# Patient Record
Sex: Male | Born: 1980 | Race: Black or African American | Hispanic: No | Marital: Single | Smoking: Never smoker
Health system: Southern US, Community
[De-identification: ages and names within clinical notes are randomized; demographics above are authoritative.]

---
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
## 2004-11-10 ENCOUNTER — Emergency Department (HOSPITAL_COMMUNITY): Admission: EM | Admit: 2004-11-10 | Discharge: 2004-11-10 | Payer: Self-pay | Admitting: Family Medicine

## 2005-10-10 HISTORY — PX: HAND SURGERY: SHX662

## 2006-04-03 ENCOUNTER — Emergency Department (HOSPITAL_COMMUNITY): Admission: EM | Admit: 2006-04-03 | Discharge: 2006-04-03 | Payer: Self-pay | Admitting: Emergency Medicine

## 2006-12-10 ENCOUNTER — Emergency Department (HOSPITAL_COMMUNITY): Admission: EM | Admit: 2006-12-10 | Discharge: 2006-12-10 | Payer: Self-pay | Admitting: Emergency Medicine

## 2006-12-14 ENCOUNTER — Ambulatory Visit (HOSPITAL_COMMUNITY): Admission: RE | Admit: 2006-12-14 | Discharge: 2006-12-15 | Payer: Self-pay | Admitting: *Deleted

## 2007-02-21 ENCOUNTER — Encounter: Admission: RE | Admit: 2007-02-21 | Discharge: 2007-04-25 | Payer: Self-pay | Admitting: *Deleted

## 2009-03-12 ENCOUNTER — Emergency Department (HOSPITAL_COMMUNITY): Admission: EM | Admit: 2009-03-12 | Discharge: 2009-03-13 | Payer: Self-pay | Admitting: Emergency Medicine

## 2009-03-20 ENCOUNTER — Emergency Department (HOSPITAL_COMMUNITY): Admission: EM | Admit: 2009-03-20 | Discharge: 2009-03-20 | Payer: Self-pay | Admitting: Family Medicine

## 2009-09-23 ENCOUNTER — Emergency Department (HOSPITAL_COMMUNITY): Admission: EM | Admit: 2009-09-23 | Discharge: 2009-09-23 | Payer: Self-pay | Admitting: Family Medicine
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
## 2011-04-02 ENCOUNTER — Emergency Department (HOSPITAL_COMMUNITY)
Admission: EM | Admit: 2011-04-02 | Discharge: 2011-04-02 | Disposition: A | Discharge status: Home / Self Care | Payer: Self-pay | Attending: Emergency Medicine | Admitting: Emergency Medicine

## 2011-04-02 DIAGNOSIS — S61409A Unspecified open wound of unspecified hand, initial encounter: Secondary | ICD-10-CM | POA: Insufficient documentation

## 2011-04-02 LAB — CBC
HCT: 39.4 % (ref 39.0–52.0)
MCHC: 36.3 g/dL — ABNORMAL HIGH (ref 30.0–36.0)
MCV: 81.7 fL (ref 78.0–100.0)
Platelets: 196 10*3/uL (ref 150–400)
RDW: 12.6 % (ref 11.5–15.5)

## 2011-04-02 LAB — POCT I-STAT, CHEM 8
Calcium, Ion: 1.12 mmol/L (ref 1.12–1.32)
Chloride: 108 mEq/L (ref 96–112)
Creatinine, Ser: 1.2 mg/dL (ref 0.50–1.35)
Glucose, Bld: 87 mg/dL (ref 70–99)
HCT: 44 % (ref 39.0–52.0)
Hemoglobin: 15 g/dL (ref 13.0–17.0)
Potassium: 4 mEq/L (ref 3.5–5.1)

## 2011-04-02 LAB — DIFFERENTIAL
Eosinophils Absolute: 0 10*3/uL (ref 0.0–0.7)
Eosinophils Relative: 1 % (ref 0–5)
Lymphocytes Relative: 22 % (ref 12–46)
Lymphs Abs: 1.8 10*3/uL (ref 0.7–4.0)
Monocytes Absolute: 0.7 10*3/uL (ref 0.1–1.0)

## 2011-04-07 ENCOUNTER — Encounter: Payer: Self-pay | Admitting: Occupational Therapy

## 2011-04-12 ENCOUNTER — Ambulatory Visit: Payer: Self-pay | Attending: Orthopedic Surgery | Admitting: Occupational Therapy

## 2011-04-12 DIAGNOSIS — R609 Edema, unspecified: Secondary | ICD-10-CM | POA: Insufficient documentation

## 2011-04-12 DIAGNOSIS — IMO0001 Reserved for inherently not codable concepts without codable children: Secondary | ICD-10-CM | POA: Insufficient documentation

## 2011-04-12 DIAGNOSIS — M25549 Pain in joints of unspecified hand: Secondary | ICD-10-CM | POA: Insufficient documentation

## 2011-04-20 ENCOUNTER — Ambulatory Visit: Payer: Self-pay | Admitting: Occupational Therapy
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
## 2011-05-03 NOTE — Op Note (Signed)
  NAME:  [name], Roger Maldonado [name]  [name]  LOCATION:  [facility]                         FACILITY:  [facility]  PHYSICIAN:  Artist Pais. Axavier Pressley, M.D.DATE OF BIRTH:  [birth year]  DATE OF PROCEDURE:  04/02/2011 DATE OF DISCHARGE:                              OPERATIVE REPORT   PREOPERATIVE DIAGNOSIS:  Deep laceration, web space, thumb, index finger, dominant right hand.  POSTOPERATIVE DIAGNOSIS:  Deep laceration, web space, thumb, index finger, dominant right hand.  PROCEDURE:  Exploration and repair of thumb abductor tendon and debridement.  SURGEON:  Artist Pais. Mina Marble, MD  ASSISTANT:  None.  ANESTHESIA:  General.  TOURNIQUET TIME:  28 minutes.  COMPLICATIONS:  None.  DRAINS:  None.  PROCEDURE IN DETAIL:  The patient was taken to operating suite after induction of general anesthesia.  Right upper extremity was prepped and draped in sterile fashion.  Esmarch was used to exsanguinate the limb. Tourniquet was inflated to 250 mmHg.  At this point in time, sutures were replaced in the emergency room, removed, the web space was opened up.  There was a complete laceration at insertion of thumb abductor tendon.  The wound was thoroughly irrigated.  Large amounts of clot were debrided.  There was a small venous structure on the volar aspect that was cauterized using bipolar cautery.  Once this was done, the thumb abductor tendons were repaired with 3-0 FiberWire suture and horizontal mattress sutures x3.  Thus shortening the web space to its normal anatomic configuration.  At this point in time, the skin was closed with 4-0 Vicryl Rapide and a combination of horizontal mattress simple sutures, was dressed with 4x4s, fluffs, and radial gutter splint.  The patient tolerated the procedure well and went to recovery room in stable fashion.     [name], M.D.    MAW/MEDQ  D:  04/02/2011  T:  04/02/2011  Job:   [id number]  Electronically Signed by Dairl Ponder M.D. on 05/03/2011 08:35:19 PM

## 2015-05-02 ENCOUNTER — Emergency Department (HOSPITAL_COMMUNITY)
Admission: EM | Admit: 2015-05-02 | Discharge: 2015-05-02 | Disposition: A | Discharge status: Home / Self Care | Payer: Self-pay | Attending: Emergency Medicine | Admitting: Emergency Medicine

## 2015-05-02 ENCOUNTER — Encounter (HOSPITAL_COMMUNITY): Payer: Self-pay | Admitting: *Deleted

## 2015-05-02 DIAGNOSIS — G8929 Other chronic pain: Secondary | ICD-10-CM | POA: Insufficient documentation

## 2015-05-02 DIAGNOSIS — M62830 Muscle spasm of back: Secondary | ICD-10-CM | POA: Insufficient documentation

## 2015-05-02 MED ORDER — NAPROXEN 500 MG PO TABS: 500.0000 mg | ORAL_TABLET | Freq: Two times a day (BID) | ORAL | Status: DC

## 2015-05-02 MED ORDER — CYCLOBENZAPRINE HCL 10 MG PO TABS: 10.0000 mg | ORAL_TABLET | Freq: Two times a day (BID) | ORAL | Status: DC | PRN

## 2015-05-02 NOTE — ED Notes (Signed)
Pt left with all belongings and ambulated out of treatment area.  

## 2015-05-02 NOTE — ED Notes (Signed)
The pt has chronic back pain .  His entire spine for the past few days it has been worse
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
## 2016-04-09 ENCOUNTER — Encounter (HOSPITAL_COMMUNITY): Payer: Self-pay | Admitting: Emergency Medicine

## 2016-04-09 ENCOUNTER — Ambulatory Visit (HOSPITAL_COMMUNITY)
Admission: EM | Admit: 2016-04-09 | Discharge: 2016-04-09 | Disposition: A | Discharge status: Home / Self Care | Payer: Self-pay | Attending: Family Medicine | Admitting: Family Medicine

## 2016-04-09 ENCOUNTER — Ambulatory Visit (HOSPITAL_COMMUNITY): Payer: Self-pay

## 2016-04-09 DIAGNOSIS — S60221A Contusion of right hand, initial encounter: Secondary | ICD-10-CM

## 2016-04-09 DIAGNOSIS — W2201XA Walked into wall, initial encounter: Secondary | ICD-10-CM | POA: Insufficient documentation

## 2016-04-09 DIAGNOSIS — M79641 Pain in right hand: Secondary | ICD-10-CM | POA: Insufficient documentation

## 2016-04-09 DIAGNOSIS — M7989 Other specified soft tissue disorders: Secondary | ICD-10-CM | POA: Insufficient documentation

## 2016-04-09 MED ORDER — IBUPROFEN 600 MG PO TABS: 600.0000 mg | ORAL_TABLET | Freq: Four times a day (QID) | ORAL | Status: DC | PRN

## 2016-04-09 NOTE — ED Notes (Signed)
Transferred to radiology in [place]

## 2016-04-09 NOTE — Discharge Instructions (Signed)
You have no evidence of fracture on your xray: This means that you severely bruised the area and it will just take time to resolve. Continue activities as tolerated and avoid direct blows to your hand. You may apply ice and take ibuprofen as needed for the pain.   Hand Contusion A hand contusion is a deep bruise on your hand area. Contusions are the result of an injury that caused bleeding under the skin. The contusion may turn blue, purple, or yellow. Minor injuries will give you a painless contusion, but more severe contusions may stay painful and swollen for a few weeks. CAUSES  A contusion is usually caused by a blow, trauma, or direct force to an area of the body. SYMPTOMS   Swelling and redness of the injured area.  Discoloration of the injured area.  Tenderness and soreness of the injured area.  Pain. DIAGNOSIS  The diagnosis can be made by taking a history and performing a physical exam. An X-ray, CT scan, or MRI may be needed to determine if there were any associated injuries, such as broken bones (fractures). TREATMENT  Often, the best treatment for a hand contusion is resting, elevating, icing, and applying cold compresses to the injured area. Over-the-counter medicines may also be recommended for pain control. HOME CARE INSTRUCTIONS   Put ice on the injured area.  Put ice in a plastic bag.  Place a towel between your skin and the bag.  Leave the ice on for 15-20 minutes, 03-04 times a day.  Only take over-the-counter or prescription medicines as directed by your caregiver. Your caregiver may recommend avoiding anti-inflammatory medicines (aspirin, ibuprofen, and naproxen) for 48 hours because these medicines may increase bruising.  If told, use an elastic wrap as directed. This can help reduce swelling. You may remove the wrap for sleeping, showering, and bathing. If your fingers become numb, cold, or blue, take the wrap off and reapply it more loosely.  Elevate your hand  with pillows to reduce swelling.  Avoid overusing your hand if it is painful. SEEK IMMEDIATE MEDICAL CARE IF:   You have increased redness, swelling, or pain in your hand.  Your swelling or pain is not relieved with medicines.  You have loss of feeling in your hand or are unable to move your fingers.  Your hand turns cold or blue.  You have pain when you move your fingers.  Your hand becomes warm to the touch.  Your contusion does not improve in 2 days. MAKE SURE YOU:   Understand these instructions.  Will watch your condition.  Will get help right away if you are not doing well or get worse.   This information is not intended to replace advice given to you by your health care provider. Make sure you discuss any questions you have with your health care provider.   Document Released: 03/18/2002 Document Revised: 06/20/2012 Document Reviewed: 03/19/2012 Elsevier Interactive Patient Education [employer]2016 [employer]

## 2016-04-09 NOTE — ED Notes (Signed)
Right hand pain, swelling.  Patient hit the wall one week ago.
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
## 2016-05-08 ENCOUNTER — Ambulatory Visit (HOSPITAL_COMMUNITY)
Admission: EM | Admit: 2016-05-08 | Discharge: 2016-05-08 | Disposition: A | Discharge status: Home / Self Care | Payer: Self-pay | Attending: Emergency Medicine | Admitting: Emergency Medicine

## 2016-05-08 ENCOUNTER — Encounter (HOSPITAL_COMMUNITY): Payer: Self-pay | Admitting: Emergency Medicine

## 2016-05-08 ENCOUNTER — Emergency Department (HOSPITAL_COMMUNITY)
Admission: EM | Admit: 2016-05-08 | Discharge: 2016-05-09 | Disposition: A | Discharge status: Home / Self Care | Payer: Self-pay | Attending: Emergency Medicine | Admitting: Emergency Medicine

## 2016-05-08 DIAGNOSIS — R197 Diarrhea, unspecified: Secondary | ICD-10-CM | POA: Insufficient documentation

## 2016-05-08 DIAGNOSIS — R000 Tachycardia, unspecified: Secondary | ICD-10-CM | POA: Insufficient documentation

## 2016-05-08 LAB — COMPREHENSIVE METABOLIC PANEL
ALBUMIN: 4.5 g/dL (ref 3.5–5.0)
ALK PHOS: 55 U/L (ref 38–126)
ALT: 31 U/L (ref 17–63)
ANION GAP: 10 (ref 5–15)
AST: 34 U/L (ref 15–41)
BUN: 13 mg/dL (ref 6–20)
CALCIUM: 9.2 mg/dL (ref 8.9–10.3)
CHLORIDE: 107 mmol/L (ref 101–111)
CO2: 19 mmol/L — ABNORMAL LOW (ref 22–32)
CREATININE: 1.07 mg/dL (ref 0.61–1.24)
GFR calc non Af Amer: >60 mL/min (ref >60)
GLUCOSE: 117 mg/dL — AB (ref 65–99)
Potassium: 3.8 mmol/L (ref 3.5–5.1)
SODIUM: 136 mmol/L (ref 135–145)
TOTAL PROTEIN: 7.2 g/dL (ref 6.5–8.1)
Total Bilirubin: 0.6 mg/dL (ref 0.3–1.2)

## 2016-05-08 LAB — CBC WITH DIFFERENTIAL/PLATELET
BASOS ABS: 0 10*3/uL (ref 0.0–0.1)
Basophils Relative: 0 %
EOS PCT: 0 %
Eosinophils Absolute: 0 10*3/uL (ref 0.0–0.7)
HCT: 41.2 % (ref 39.0–52.0)
Hemoglobin: 14.4 g/dL (ref 13.0–17.0)
LYMPHS PCT: 15 %
Lymphs Abs: 2.1 10*3/uL (ref 0.7–4.0)
MCH: 29 pg (ref 26.0–34.0)
MCHC: 35 g/dL (ref 30.0–36.0)
MCV: 82.9 fL (ref 78.0–100.0)
MONO ABS: 0.8 10*3/uL (ref 0.1–1.0)
Monocytes Relative: 5 %
Neutro Abs: 11.1 10*3/uL — ABNORMAL HIGH (ref 1.7–7.7)
Neutrophils Relative %: 80 %
PLATELETS: 219 10*3/uL (ref 150–400)
RBC: 4.97 MIL/uL (ref 4.22–5.81)
RDW: 13.4 % (ref 11.5–15.5)
WBC: 13.9 10*3/uL — ABNORMAL HIGH (ref 4.0–10.5)

## 2016-05-08 MED ORDER — SODIUM CHLORIDE 0.9 % IV BOLUS (SEPSIS)
2000.0000 mL | Freq: Once | INTRAVENOUS | Status: AC
  Administered 2016-05-08: 2000 mL via INTRAVENOUS

## 2016-05-08 MED ORDER — SODIUM CHLORIDE 0.9 % IV SOLN
Freq: Once | INTRAVENOUS | Status: DC
  Administered 2016-05-08: 21:00:00 via INTRAVENOUS

## 2016-05-08 NOTE — ED Notes (Signed)
Dr. Jacqulyn Bath notified of pt HR

## 2016-05-08 NOTE — ED Provider Notes (Signed)
Emergency Department Provider Note   I have reviewed the triage vital signs and the nursing notes.   HISTORY  Chief Complaint Tachycardia   HPI Roger Maldonado is a 35 y.o. male with no significant PMH presents to the emergency department for evaluation of tachycardia in the setting of diarrhea throughout the night. Patient states he is tracking alcohol last night but denies using other drugs. He developed approximately 3 episodes of nonbloody diarrhea which prompted his visit to urgent care today. He denies any associated abdominal pain, chest pain, difficulty breathing. During that evaluation is found to have a heart rate greater than 150 and transferred to the emergency department after IV fluids. Patient has no other complaints such as nausea or vomiting. No palpitations.    History reviewed. No pertinent past medical history.  There are no active problems to display for this patient.   History reviewed. No pertinent surgical history.    Allergies Review of patient's allergies indicates no known allergies.  No family history on file.  Social History Social History  Substance Use Topics  . Smoking status: Never Smoker  . Smokeless tobacco: Not on file  . Alcohol use Yes    Review of Systems  Constitutional: No fever/chills Eyes: No visual changes. ENT: No sore throat. Cardiovascular: Denies chest pain. Respiratory: Denies shortness of breath. Gastrointestinal: No abdominal pain.  No nausea, no vomiting. Positive diarrhea.  No constipation. Genitourinary: Negative for dysuria. Musculoskeletal: Negative for back pain. Skin: Negative for rash. Neurological: Negative for headaches, focal weakness or numbness.  10-point ROS otherwise negative.  ____________________________________________   PHYSICAL EXAM:  VITAL SIGNS: ED Triage Vitals  Enc Vitals Group     BP 05/08/16 2125 173/90     Pulse Rate 05/08/16 2125 (!) 135     Resp 05/08/16 2125 19     Temp  05/08/16 2125 99.3 F (37.4 C)     Temp Source 05/08/16 2125 Oral     SpO2 05/08/16 2125 97 %     Pain Score 05/08/16 2122 0   Constitutional: Alert and oriented. Well appearing and in no acute distress. Eyes: Conjunctivae are normal. PERRL. EOMI. Head: Atraumatic. Nose: No congestion/rhinnorhea. Mouth/Throat: Mucous membranes are moist.  Oropharynx non-erythematous. Neck: No stridor.  Cardiovascular: Tachycardia. Good peripheral circulation. Grossly normal heart sounds.   Respiratory: Normal respiratory effort.  No retractions. Lungs CTAB. Gastrointestinal: Soft and nontender. No distention.  Musculoskeletal: No lower extremity tenderness nor edema. No gross deformities of extremities. Neurologic:  Normal speech and language. No gross focal neurologic deficits are appreciated.  Skin:  Skin is warm, dry and intact. No rash noted. Psychiatric: Mood and affect are normal. Speech and behavior are normal.  ____________________________________________   LABS (all labs ordered are listed, but only abnormal results are displayed)  Labs Reviewed  COMPREHENSIVE METABOLIC PANEL - Abnormal; Notable for the following:       Result Value   CO2 19 (*)    Glucose, Bld 117 (*)    All other components within normal limits  CBC WITH DIFFERENTIAL/PLATELET - Abnormal; Notable for the following:    WBC 13.9 (*)    Neutro Abs 11.1 (*)    All other components within normal limits   ____________________________________________  EKG  Sinus tachycardia. Reviewed in MUSE.  ____________________________________________  RADIOLOGY  None ____________________________________________   PROCEDURES  Procedure(s) performed:   Procedures  None ____________________________________________   INITIAL IMPRESSION / ASSESSMENT AND PLAN / ED COURSE  Pertinent labs & imaging results that  were available during my care of the patient were reviewed by me and considered in my medical decision making (see  chart for details).  Patient resents to the emergency department for evaluation of tachycardia in the setting of diarrhea. Patient with a sinus tachycardia on EKG rate 132 down from 150s at [facility] after IVF. Clinically suspect dehydration to leave the underlying cause of the patient's symptoms. He has no chest pain or hypoxemia to suggest pulmonary embolism. Patient has no risk factors for ACS. Denies illicit drug use or prescription drug use. Patient is awake, alert, well perfused. We'll check baseline labs and give 2 L of additional IV fluids. Plan for reassessment at that time with likely discharge.   10:57 PM Patient with leukocytosis on labs and mild decrease is CO2. Patient is asking to be discharged and reports that he feels fine. Denies pain. Repeat abdominal exam is negative for focal tenderness. Discussed return precautions in detail. HR remains elevated but is down-trending with IVF.   At this time, I do not feel there is any life-threatening condition present. I have reviewed and discussed all results (EKG, imaging, lab, urine as appropriate), exam findings with patient. I have reviewed nursing notes and appropriate previous records.  I feel the patient is safe to be discharged home without further emergent workup. Discussed usual and customary return precautions. Patient and family (if present) verbalize understanding and are comfortable with this plan.  Patient will follow-up with their primary care provider. If they do not have a primary care provider, information for follow-up has been provided to them. All questions have been answered.  ____________________________________________  FINAL CLINICAL IMPRESSION(S) / ED DIAGNOSES  Final diagnoses:  Tachycardia  Diarrhea, unspecified type     MEDICATIONS GIVEN DURING THIS VISIT:  Medications  sodium chloride 0.9 % bolus 2,000 mL (2,000 mLs Intravenous New Bag/Given 05/08/16 2200)     NEW OUTPATIENT MEDICATIONS STARTED DURING THIS  VISIT:  None   Note:  This document was prepared using Dragon voice recognition software and may include unintentional dictation errors.  Alona Bene, MD Emergency Medicine   Maia Plan, MD 05/08/16 [id number]

## 2016-05-08 NOTE — ED Triage Notes (Signed)
The patient presented to the [facility] with a complaint of epigastric abdominal pain along with N/V/D that started last night.

## 2016-05-08 NOTE — ED Triage Notes (Signed)
Per [facility], pt initially c/o abdominal and diarrhea at UC, during assessment at UC, pt HR 150s, suspected of dehydration. Pt denies doing drugs. Pt resting heart rate 136. Pt denies SOB, denies CP. BP 167/106. Ambulatory. Pt given 600cc saline by UC.

## 2016-05-08 NOTE — ED Notes (Signed)
Patient placed on cardiac monitor per Provider's verbal order.

## 2016-05-08 NOTE — ED Provider Notes (Signed)
CSN: [id number]     Arrival date & time 05/08/16  1903 History   None    Chief Complaint  Patient presents with  . Abdominal Pain   (Consider location/radiation/quality/duration/timing/severity/associated sxs/prior Treatment)  HPI   The patient is a 35 year old male presenting with abdominal pain with nausea vomiting and diarrhea starting yesterday after eating some [place] food.   History reviewed. No pertinent past medical history. History reviewed. No pertinent surgical history. History reviewed. No pertinent family history. Social History  Substance Use Topics  . Smoking status: Never Smoker  . Smokeless tobacco: Not on file  . Alcohol use Yes    Review of Systems  Constitutional: Negative.  Negative for chills, fatigue and fever.  HENT: Negative.   Eyes: Negative.   Respiratory: Negative.  Negative for chest tightness, shortness of breath and wheezing.   Cardiovascular: Negative.  Negative for chest pain.  Gastrointestinal: Positive for abdominal pain, diarrhea, nausea and vomiting.  Endocrine: Negative.   Genitourinary: Negative.   Musculoskeletal: Negative.   Skin: Negative.  Negative for color change.  Allergic/Immunologic: Negative.   Neurological: Negative.   Hematological: Negative.   Psychiatric/Behavioral: Negative.       Allergies  Review of patient's allergies indicates no known allergies.  Home Medications   Prior to Admission medications   Medication Sig Start Date End Date Taking? Authorizing Provider  cyclobenzaprine (FLEXERIL) 10 MG tablet Take 1 tablet (10 mg total) by mouth 2 (two) times daily as needed for muscle spasms. 05/02/15   Hope Orlene Och, NP  ibuprofen (ADVIL,MOTRIN) 600 MG tablet Take 1 tablet (600 mg total) by mouth every 6 (six) hours as needed for moderate pain. 04/09/16   Tyrone Nine, MD  naproxen (NAPROSYN) 500 MG tablet Take 1 tablet (500 mg total) by mouth 2 (two) times daily. 05/02/15   Hope Orlene Och, NP   Meds Ordered and  Administered this Visit   Medications - No data to display  BP 157/90 (BP Location: Left Arm)   Pulse (!) 124   Temp 100.7 F (38.2 C) (Temporal)   Resp 18   SpO2 99%  No data found.   Physical Exam  Constitutional: He appears well-developed and well-nourished. No distress.  Cardiovascular: Regular rhythm and intact distal pulses.  Tachycardia present.  PMI is not displaced.  Exam reveals no gallop, no distant heart sounds and no friction rub.   No murmur heard. S1/S2 difficult to evaluate secondary to tachycardia.  Patient has resting HR greater than 150.  Does not appear to be in any acute distress and denies chest pain or SOB.   Pulmonary/Chest: Effort normal and breath sounds normal. No respiratory distress. He has no wheezes. He has no rales. He exhibits no tenderness.  Abdominal: Soft. He exhibits no distension and no mass. There is no tenderness. There is no rebound and no guarding. No hernia.  Heartbeat audible in all quadrants of abdomen.   Skin: Skin is warm and dry. He is not diaphoretic.  Nursing note and vitals reviewed.    Urgent Care Course   Clinical Course    Procedures (including critical care time)  Labs Review Labs Reviewed - No data to display  Imaging Review No results found.  EKG shows sinus tachycardia otherwise normal ECG  ventricular rate is 154 bpm  PR interval 116 ms  QRS duration 78 ms  QT/QTc is 264/422 ms  P R T axes 72 68 47  MDM   1. Tachycardia    Plan  of care was discussed with Dr. Piedad Climes.  EKG and IV fluids wide open and transfer to [facility] ED for further evaluation and work up. The patient verbalizes understanding and agrees to plan of care.       Servando Salina, NP 05/08/16 2128

## 2016-05-08 NOTE — Discharge Instructions (Signed)
You were seen in the ED today with diarrhea. We evaluated you closely and believe that you are safe to return home. We gave IV fluids for your high heart rate but you should continue to drink water to stay hydrated. Return to the emergency department immediately with any abdominal pain, lightheadedness, blood in the diarrhea, chest pain, or difficulty breathing.

## 2016-05-08 NOTE — ED Notes (Signed)
Patient report called to Clydie Braun, Geophysicist/field seismologist at [facility] ED

## 2016-07-13 ENCOUNTER — Encounter (HOSPITAL_COMMUNITY): Payer: Self-pay | Admitting: Emergency Medicine

## 2016-07-13 ENCOUNTER — Ambulatory Visit (HOSPITAL_COMMUNITY)
Admission: EM | Admit: 2016-07-13 | Discharge: 2016-07-13 | Disposition: A | Discharge status: Home / Self Care | Payer: Self-pay | Attending: Family Medicine | Admitting: Family Medicine

## 2016-07-13 DIAGNOSIS — K0889 Other specified disorders of teeth and supporting structures: Secondary | ICD-10-CM

## 2016-07-13 DIAGNOSIS — K047 Periapical abscess without sinus: Secondary | ICD-10-CM

## 2016-07-13 MED ORDER — IBUPROFEN 800 MG PO TABS: 800.0000 mg | ORAL_TABLET | Freq: Three times a day (TID) | ORAL | 0 refills | Status: DC

## 2016-07-13 MED ORDER — CLINDAMYCIN HCL 300 MG PO CAPS: 300.0000 mg | ORAL_CAPSULE | Freq: Three times a day (TID) | ORAL | 0 refills | Status: AC

## 2016-07-13 NOTE — ED Provider Notes (Signed)
CSN: [id number]     Arrival date & time 07/13/16  1559 History   First MD Initiated Contact with Patient 07/13/16 1707     Chief Complaint  Patient presents with  . Dental Pain   (Consider location/radiation/quality/duration/timing/severity/associated sxs/prior Treatment) Patient reports not having a dentist.    The history is provided by the patient.  Dental Pain  Location:  Lower Lower teeth location:  32/RL 3rd molar Quality:  Unable to specify Severity:  Moderate (7/10) Onset quality:  Sudden Duration:  1 day Timing:  Constant Progression:  Waxing and waning Chronicity:  New Context: abscess, dental caries, dental fracture and poor dentition   Relieved by:  None tried Ineffective treatments:  None tried   History reviewed. No pertinent past medical history. History reviewed. No pertinent surgical history. History reviewed. No pertinent family history. Social History  Substance Use Topics  . Smoking status: Never Smoker  . Smokeless tobacco: Not on file  . Alcohol use Yes    Review of Systems  All other systems reviewed and are negative.   Allergies  Review of patient's allergies indicates no known allergies.  Home Medications   Prior to Admission medications   Medication Sig Start Date End Date Taking? Authorizing Provider  clindamycin (CLEOCIN) 300 MG capsule Take 1 capsule (300 mg total) by mouth 3 (three) times daily. 07/13/16 07/20/16  Lucia EstelleFeng Adrienne Trombetta, NP  ibuprofen (ADVIL,MOTRIN) 800 MG tablet Take 1 tablet (800 mg total) by mouth 3 (three) times daily. 07/13/16   Lucia EstelleFeng Haruye Lainez, NP   Meds Ordered and Administered this Visit  Medications - No data to display  BP 130/67 (BP Location: Left Arm)   Pulse 105   Temp 98.5 F (36.9 C) (Oral)   Resp 12   SpO2 100%  No data found.   Physical Exam  Constitutional: He appears well-developed and well-nourished.  HENT:  Mouth/Throat:    Dental pain at the left lower 3rd molar with swelling at the gumline and  tender to palpate at the gumline. No facial swelling present.  Cardiovascular: Normal rate.   Pulmonary/Chest: Effort normal.  Nursing note and vitals reviewed.   Urgent Care Course   Clinical Course    Procedures (including critical care time)  Labs Review Labs Reviewed - No data to display  Imaging Review No results found.   Visual Acuity Review  Right Eye Distance:   Left Eye Distance:   Bilateral Distance:    Right Eye Near:   Left Eye Near:    Bilateral Near:         MDM   1. Pain, dental   2. Dental abscess    Prescriptions given (see above). Reviewed directions for usage and side effects. Patient states understanding and will call with questions or problems. Patient referral to a dentist; instructed to f/u with the dentist.  Discharge instruction given.    Lucia EstelleFeng Ashlynne Shetterly, NP 07/13/16 1744

## 2016-07-13 NOTE — ED Triage Notes (Signed)
[facility] pain secondary to a possible abscess that started hurting today.

## 2016-07-13 NOTE — Discharge Instructions (Signed)
Please call Dr. Norris Crossurner's office at the number below to schedule an appointment. Take the ibuprofen for pain and the antibiotic for the infection.

## 2016-09-22 ENCOUNTER — Ambulatory Visit: Payer: Self-pay

## 2016-09-22 ENCOUNTER — Other Ambulatory Visit: Payer: Self-pay | Admitting: Occupational Medicine

## 2016-09-22 DIAGNOSIS — M25572 Pain in left ankle and joints of left foot: Secondary | ICD-10-CM

## 2016-09-22 DIAGNOSIS — M25531 Pain in right wrist: Secondary | ICD-10-CM

## 2016-09-23 ENCOUNTER — Ambulatory Visit (HOSPITAL_COMMUNITY)
Admission: EM | Admit: 2016-09-23 | Discharge: 2016-09-23 | Disposition: A | Discharge status: Home / Self Care | Payer: Self-pay | Attending: Internal Medicine | Admitting: Internal Medicine

## 2016-09-23 ENCOUNTER — Ambulatory Visit (HOSPITAL_COMMUNITY): Payer: Self-pay

## 2016-09-23 ENCOUNTER — Encounter (HOSPITAL_COMMUNITY): Payer: Self-pay | Admitting: Emergency Medicine

## 2016-09-23 DIAGNOSIS — S6991XA Unspecified injury of right wrist, hand and finger(s), initial encounter: Secondary | ICD-10-CM

## 2016-09-23 MED ORDER — IBUPROFEN 800 MG PO TABS: 800.0000 mg | ORAL_TABLET | Freq: Three times a day (TID) | ORAL | 0 refills | Status: DC

## 2016-09-23 NOTE — ED Provider Notes (Signed)
CSN: [id number]     Arrival date & time 09/23/16  1658 History   First MD Initiated Contact with Patient 09/23/16 1747     Chief Complaint  Patient presents with  . Hand Injury   (Consider location/radiation/quality/duration/timing/severity/associated sxs/prior Treatment) Patient fell and injured right wrist a couple days ago and he is having right wrist discomfort. He had hx of ORIF right wrist due to fracture and wants to make sure the screws and plates are still intact.   The history is provided by the patient.  Hand Injury  Location:  Wrist Wrist location:  R wrist Injury: yes   Time since incident:  2 days Pain details:    Quality:  Aching   Radiates to:  R wrist   Severity:  Moderate   Onset quality:  Sudden   Duration:  2 days   Timing:  Constant   Progression:  Partially resolved Handedness:  Right-handed Dislocation: no   Foreign body present:  No foreign bodies Tetanus status:  Unknown Prior injury to area:  No Relieved by:  Nothing Worsened by:  Nothing   History reviewed. No pertinent past medical history. Past Surgical History:  Procedure Laterality Date  . HAND SURGERY Right 2007   History reviewed. No pertinent family history. Social History  Substance Use Topics  . Smoking status: Never Smoker  . Smokeless tobacco: Never Used  . Alcohol use Yes    Review of Systems  Constitutional: Negative.   HENT: Negative.   Eyes: Negative.   Respiratory: Negative.   Cardiovascular: Negative.   Gastrointestinal: Negative.   Endocrine: Negative.   Genitourinary: Negative.   Musculoskeletal: Positive for arthralgias.  Skin: Negative.   Allergic/Immunologic: Negative.   Neurological: Negative.   Hematological: Negative.   Psychiatric/Behavioral: Negative.     Allergies  Patient has no known allergies.  Home Medications   Prior to Admission medications   Medication Sig Start Date End Date Taking? Authorizing Provider  ibuprofen (ADVIL,MOTRIN) 800  MG tablet Take 1 tablet (800 mg total) by mouth 3 (three) times daily. 09/23/16   Deatra CanterWilliam J Oxford, FNP   Meds Ordered and Administered this Visit  Medications - No data to display  BP (!) 140/110 (BP Location: Left Arm)   Pulse 98   Temp 98.4 F (36.9 C) (Oral)   Resp 20   SpO2 100%  No data found.   Physical Exam  Constitutional: He appears well-developed and well-nourished.  HENT:  Head: Normocephalic and atraumatic.  Eyes: Conjunctivae and EOM are normal. Pupils are equal, round, and reactive to light.  Cardiovascular: Normal rate, regular rhythm and normal heart sounds.   Pulmonary/Chest: Effort normal and breath sounds normal.  Abdominal: Soft. Bowel sounds are normal.  Musculoskeletal: He exhibits tenderness.  TTP right wrist  Nursing note and vitals reviewed.   Urgent Care Course   Clinical Course     Procedures (including critical care time)  Labs Review Labs Reviewed - No data to display  Imaging Review Dg Wrist Complete Right  Result Date: 09/22/2016 CLINICAL DATA:  Fall today.  History of fracture EXAM: RIGHT WRIST - COMPLETE 3+ VIEW COMPARISON:  04/09/2016 FINDINGS: Chronic fracture distal radius with dorsal plate and screw fixation. Satisfactory fracture healing. Chronic fracture deformity distal ulna. Negative for acute fracture. Cyst in the distal navicular with surrounding sclerosis unchanged from the prior study. This may be degenerative. IMPRESSION: Negative for acute fracture. Cyst in the distal navicular unchanged. Electronically Signed   By: Marlan Palauharles  Clark M.D.  On: 09/22/2016 11:23   Dg Ankle Complete Left  Result Date: 09/22/2016 CLINICAL DATA:  Fall today.  Medial left ankle pain. EXAM: LEFT ANKLE COMPLETE - 3+ VIEW COMPARISON:  None. FINDINGS: There is no evidence of fracture, dislocation, or joint effusion. There is no evidence of arthropathy or other focal bone abnormality. Soft tissues are unremarkable. IMPRESSION: Negative. Electronically  Signed   By: Charlett NoseKevin  Dover M.D.   On: 09/22/2016 11:22     Visual Acuity Review  Right Eye Distance:   Left Eye Distance:   Bilateral Distance:    Right Eye Near:   Left Eye Near:    Bilateral Near:         MDM   1. Injury of right wrist, initial encounter    Motrin 800mg  one po tid x 10 days Reassured patient that his xrays show no acute abnormalities.    Deatra CanterWilliam J Oxford, FNP 09/23/16 [phone]

## 2016-09-23 NOTE — ED Triage Notes (Signed)
Pt reports he fell backwarkds 2 days ago at work onto concrete flooring and inj his right hand/wrist  Pain increases w/activity  Reports hx of surg to right hand w/hardware  No deformity visible.... Radial pulse 2+   A&O x4... NAD

## 2016-09-23 NOTE — Discharge Instructions (Signed)
Wrist Pain, Adult There are many things that can cause wrist pain. Some common causes include:  An injury to the wrist area.  Overuse of the joint.  A condition that causes too much pressure to be put on a nerve in the wrist (carpal tunnel syndrome).  Wear and tear of the joints that happens as a person gets older (osteoarthritis).  Other types of arthritis.  Sometimes, the cause of wrist pain is not known. Often, the pain goes away when you follow your doctor's instructions for helping pain at home, such as resting or icing your wrist. If your wrist pain does not go away, it is important to tell your doctor. Follow these instructions at home:  Rest the wrist area for 48 hours or more, or as long as told by your doctor.  If a splint or elastic bandage has been put on your wrist, use it as told by your doctor. ? Take off the splint or bandage only as told by your doctor. ? Loosen the splint or bandage if your fingers tingle, lose feeling (get numb), or turn cold or blue.  If directed, apply ice to the injured area: ? If you have a removable splint or elastic bandage, remove it as told by your doctor. ? Put ice in a plastic bag. ? Place a towel between your skin and the bag or between your splint or bandage and the bag. ? Leave the ice on for 20 minutes, 2-3 times a day.  Keep your arm raised (elevated) above the level of your heart while you are sitting or lying down.  Take over-the-counter and prescription medicines only as told by your doctor.  Keep all follow-up visits as told by your doctor. This is important. Contact a doctor if:  You have a sudden sharp pain in the wrist, hand, or arm that is different or new.  The swelling or bruising on your wrist or hand gets worse.  Your skin becomes red, gets a rash, or has open sores.  Your pain does not get better or it gets worse. Get help right away if:  You lose feeling in your fingers or hand.  Your fingers turn white,  very red, or cold and blue.  You cannot move your fingers.  You have a fever or chills. This information is not intended to replace advice given to you by your health care provider. Make sure you discuss any questions you have with your health care provider. Document Released: 03/14/2008 Document Revised: 04/21/2016 Document Reviewed: 04/14/2016 Elsevier Interactive Patient Education  2017 [employer]  

## 2019-01-30 ENCOUNTER — Other Ambulatory Visit: Payer: Self-pay

## 2019-01-30 ENCOUNTER — Encounter (HOSPITAL_COMMUNITY): Payer: Self-pay | Admitting: Emergency Medicine

## 2019-01-30 ENCOUNTER — Ambulatory Visit (HOSPITAL_COMMUNITY)
Admission: EM | Admit: 2019-01-30 | Discharge: 2019-01-30 | Disposition: A | Discharge status: Home / Self Care | Payer: BLUE CROSS/BLUE SHIELD | Attending: Family Medicine | Admitting: Family Medicine

## 2019-01-30 DIAGNOSIS — M545 Low back pain, unspecified: Secondary | ICD-10-CM

## 2019-01-30 MED ORDER — CYCLOBENZAPRINE HCL 5 MG PO TABS: 5.0000 mg | ORAL_TABLET | Freq: Two times a day (BID) | ORAL | 0 refills | Status: DC | PRN

## 2019-01-30 MED ORDER — IBUPROFEN 800 MG PO TABS: 800.0000 mg | ORAL_TABLET | Freq: Three times a day (TID) | ORAL | 0 refills | Status: DC

## 2019-01-30 NOTE — Discharge Instructions (Addendum)
Use anti-inflammatories for pain/swelling. You may take up to 800 mg Ibuprofen every 8 hours with food. You may supplement Ibuprofen with Tylenol [phone] mg every 8 hours.   You may use flexeril as needed to help with pain. This is a muscle relaxer and causes sedation- please use only at bedtime or when you will be home and not have to drive/work  Alternate Ice and heat  Follow up if not improving

## 2019-01-30 NOTE — ED Triage Notes (Signed)
Pt sts lower back pain after lifting heavy objects at work

## 2019-01-30 NOTE — ED Provider Notes (Signed)
[facility]    CSN: [id number] Arrival date & time: 01/30/19  1918     History   Chief Complaint Chief Complaint  Patient presents with  . Back Pain    HPI Roger Maldonado is a 38 y.o. male no significant past medical history presenting today for evaluation back pain.  Patient states that over the past 2 days he has had gradually worsening back pain.  Denies specific inciting incident or specific injury.  Denies history of back pain.  Denies any numbness or tingling.  Denies radiation into legs.  Notes that he does frequent heavy lifting at work.  He has not taken anything for his symptoms.  Denies issues with urination or bowel movements.  Denies urinary symptoms of dysuria, increased frequency or penile discharge.  Denies hematuria.  Denies history of kidney stones.  HPI  History reviewed. No pertinent past medical history.  There are no active problems to display for this patient.   Past Surgical History:  Procedure Laterality Date  . HAND SURGERY Right 2007       Home Medications    Prior to Admission medications   Medication Sig Start Date End Date Taking? Authorizing Provider  cyclobenzaprine (FLEXERIL) 5 MG tablet Take 1-2 tablets (5-10 mg total) by mouth 2 (two) times daily as needed for muscle spasms. 01/30/19   Ziare Cryder C, PA-C  ibuprofen (ADVIL) 800 MG tablet Take 1 tablet (800 mg total) by mouth 3 (three) times daily. 01/30/19   Keona Bilyeu, Junius CreamerHallie C, PA-C    Family History History reviewed. No pertinent family history.  Social History Social History   Tobacco Use  . Smoking status: Never Smoker  . Smokeless tobacco: Never Used  Substance Use Topics  . Alcohol use: Yes  . Drug use: No     Allergies   Patient has no known allergies.   Review of Systems Review of Systems  Constitutional: Negative for fatigue and fever.  Eyes: Negative for redness, itching and visual disturbance.  Respiratory: Negative for shortness of breath.    Cardiovascular: Negative for chest pain and leg swelling.  Gastrointestinal: Negative for nausea and vomiting.  Genitourinary: Negative for decreased urine volume, difficulty urinating, hematuria and urgency.  Musculoskeletal: Positive for back pain and myalgias. Negative for arthralgias.  Skin: Negative for color change, rash and wound.  Neurological: Negative for dizziness, syncope, weakness, light-headedness and headaches.     Physical Exam Triage Vital Signs ED Triage Vitals  Enc Vitals Group     BP 01/30/19 1931 (!) 170/99     Pulse Rate 01/30/19 1931 87     Resp 01/30/19 1931 18     Temp 01/30/19 1931 98 F (36.7 C)     Temp Source 01/30/19 1931 Oral     SpO2 01/30/19 1931 99 %     Weight --      Height --      Head Circumference --      Peak Flow --      Pain Score 01/30/19 1932 8     Pain Loc --      Pain Edu? --      Excl. in [place]? --    No data found.  Updated Vital Signs BP (!) 170/99 (BP Location: Left Arm)   Pulse 87   Temp 98 F (36.7 C) (Oral)   Resp 18   SpO2 99%   Visual Acuity Right Eye Distance:   Left Eye Distance:   Bilateral Distance:  Right Eye Near:   Left Eye Near:    Bilateral Near:     Physical Exam Vitals signs and nursing note reviewed.  Constitutional:      Appearance: He is well-developed.  HENT:     Head: Normocephalic and atraumatic.  Eyes:     Conjunctiva/sclera: Conjunctivae normal.  Neck:     Musculoskeletal: Neck supple.  Cardiovascular:     Rate and Rhythm: Normal rate and regular rhythm.     Heart sounds: No murmur.  Pulmonary:     Effort: Pulmonary effort is normal. No respiratory distress.     Breath sounds: Normal breath sounds.  Abdominal:     Palpations: Abdomen is soft.     Tenderness: There is no abdominal tenderness.  Musculoskeletal:     Comments: Nontender to palpation throughout entire cervical, thoracic and lumbar spine midline, minimal reproducible tenderness throughout bilateral lumbar  musculature and into gluteal area Negative straight leg raise bilaterally  Strength 5/5 and equal bilaterally at hips and knees, patellar reflex 2+  Gait without abnormality  Skin:    General: Skin is warm and dry.  Neurological:     Mental Status: He is alert.      UC Treatments / Results  Labs (all labs ordered are listed, but only abnormal results are displayed) Labs Reviewed - No data to display  EKG None  Radiology No results found.  Procedures Procedures (including critical care time)  Medications Ordered in UC Medications - No data to display  Initial Impression / Assessment and Plan / UC Course  I have reviewed the triage vital signs and the nursing notes.  Pertinent labs & imaging results that were available during my care of the patient were reviewed by me and considered in my medical decision making (see chart for details).     Back pain likely secondary to recurrent heavy lifting, likely muscular.  Do not suspect underlying bony abnormality.  Will recommend conservative treatment with anti-inflammatories and muscle relaxers.  Discussed drowsiness regarding Flexeril.  Continue to monitor, discussed activity modification.  Work note provided.Discussed strict return precautions. Patient verbalized understanding and is agreeable with plan.  Final Clinical Impressions(s) / UC Diagnoses   Final diagnoses:  Acute bilateral low back pain without sciatica     Discharge Instructions     Use anti-inflammatories for pain/swelling. You may take up to 800 mg Ibuprofen every 8 hours with food. You may supplement Ibuprofen with Tylenol [id number] mg every 8 hours.   You may use flexeril as needed to help with pain. This is a muscle relaxer and causes sedation- please use only at bedtime or when you will be home and not have to drive/work  Alternate Ice and heat  Follow up if not improving   ED Prescriptions    Medication Sig Dispense Auth. Provider   ibuprofen  (ADVIL) 800 MG tablet Take 1 tablet (800 mg total) by mouth 3 (three) times daily. 21 tablet Sayan Aldava C, PA-C   cyclobenzaprine (FLEXERIL) 5 MG tablet Take 1-2 tablets (5-10 mg total) by mouth 2 (two) times daily as needed for muscle spasms. 24 tablet Marvella Jenning, [place] C, PA-C     Controlled Substance Prescriptions [place] Controlled Substance Registry consulted? Not Applicable   Lew Dawes, [place] 01/31/19 1004

## 2019-03-28 ENCOUNTER — Other Ambulatory Visit: Payer: Self-pay

## 2019-03-28 ENCOUNTER — Encounter (HOSPITAL_COMMUNITY): Payer: Self-pay | Admitting: Family Medicine

## 2019-03-28 ENCOUNTER — Ambulatory Visit (INDEPENDENT_AMBULATORY_CARE_PROVIDER_SITE_OTHER): Payer: BC Managed Care – PPO

## 2019-03-28 ENCOUNTER — Ambulatory Visit (HOSPITAL_COMMUNITY)
Admission: EM | Admit: 2019-03-28 | Discharge: 2019-03-28 | Disposition: A | Discharge status: Home / Self Care | Payer: BC Managed Care – PPO | Attending: Family Medicine | Admitting: Family Medicine

## 2019-03-28 DIAGNOSIS — S63501A Unspecified sprain of right wrist, initial encounter: Secondary | ICD-10-CM

## 2019-03-28 MED ORDER — PREDNISONE 20 MG PO TABS: ORAL_TABLET | ORAL | 0 refills | Status: AC

## 2019-03-28 NOTE — ED Provider Notes (Signed)
[place]    CSN: [id number] Arrival date & time: 03/28/19  1541     History   Chief Complaint Chief Complaint  Patient presents with  . Wrist Pain    HPI Roger Maldonado is a 38 y.o. male.   38 yo [place] established patient with wrist pain.  He has had surgical fixation of distal radius.  The right wrist has been getting progressively sore just this week without any trauma.  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  No other joints.  The pain is predominantly in the distal ulnar styloid.  Has not taken anything for pain or inflammation    RIGHT WRIST - COMPLETE 3+ VIEW  COMPARISON:  04/09/2016  FINDINGS: Chronic fracture distal radius with dorsal plate and screw fixation. Satisfactory fracture healing. Chronic fracture deformity distal ulna.  Negative for acute fracture.  Cyst in the distal navicular with surrounding sclerosis unchanged from the prior study. This may be degenerative.  IMPRESSION: Negative for acute fracture.  Cyst in the distal navicular unchanged.   Electronically Signed   By: Franchot Gallo M.D.   On: 09/22/2016 11:23      History reviewed. No pertinent past medical history.  There are no active problems to display for this patient.   Past Surgical History:  Procedure Laterality Date  . HAND SURGERY Right 2007       Home Medications    Prior to Admission medications   Medication Sig Start Date End Date Taking? Authorizing Provider  predniSONE (DELTASONE) 20 MG tablet Two daily with food 03/28/19   Robyn Haber, MD    Family History Family History  Family history unknown: Yes    Social History Social History   Tobacco Use  . Smoking status: Never Smoker  . Smokeless tobacco: Never Used  Substance Use Topics  . Alcohol use: Yes  . Drug use: No     Allergies   Patient has no known allergies.   Review of Systems Review of Systems  All other systems reviewed and are negative.     Physical Exam Triage Vital Signs ED Triage Vitals  Enc Vitals Group     BP      Pulse      Resp      Temp      Temp src      SpO2      Weight      Height      Head Circumference      Peak Flow      Pain Score      Pain Loc      Pain Edu?      Excl. in [place]?    No data found.  Updated Vital Signs BP (!) 160/112 (BP Location: Left Arm)   Pulse 93   Temp 98.3 F (36.8 C) (Oral)   Resp 18   SpO2 99%    Physical Exam Vitals signs and nursing note reviewed.  Constitutional:      Appearance: Normal appearance. He is normal weight.  Eyes:     Conjunctiva/sclera: Conjunctivae normal.  Neck:     Musculoskeletal: Normal range of motion and neck supple.  Pulmonary:     Effort: Pulmonary effort is normal.  Musculoskeletal: Normal range of motion.        General: Tenderness present. No swelling, deformity or signs of injury.     Comments: Right wrist:  Dorsal and volar distal radius surgical scars.  Good thumb and finger movement.  No numbness.  No deformity.  Tender ulnar styloid.  Skin:    General: Skin is warm and dry.  Neurological:     General: No focal deficit present.     Mental Status: He is alert.      UC Treatments / Results  Labs (all labs ordered are listed, but only abnormal results are displayed) Labs Reviewed - No data to display  EKG None  Radiology Right wrist films unchanged from 2 years ago  Procedures Procedures (including critical care time)  Medications Ordered in UC Medications - No data to display  Initial Impression / Assessment and Plan / UC Course  I have reviewed the triage vital signs and the nursing notes.  Pertinent labs & imaging results that were available during my care of the patient were reviewed by me and considered in my medical decision making (see chart for details).    Final Clinical Impressions(s) / UC Diagnoses   Final diagnoses:  Sprain of right wrist, initial encounter     Discharge Instructions      Start the prednisone tonight Expect improvement over next 24 hours.    ED Prescriptions    Medication Sig Dispense Auth. Provider   predniSONE (DELTASONE) 20 MG tablet Two daily with food 10 tablet Elvina SidleLauenstein, Vermon Grays, MD     Controlled Substance Prescriptions [place] Controlled Substance Registry consulted? Not Applicable   Elvina SidleLauenstein, Chasidy Janak, MD 03/28/19 1723

## 2019-03-28 NOTE — ED Triage Notes (Signed)
Pt presents with chronic right wrist pain.

## 2019-03-28 NOTE — Discharge Instructions (Addendum)
Start the prednisone tonight Expect improvement over next 24 hours.

## 2021-01-15 ENCOUNTER — Emergency Department (HOSPITAL_COMMUNITY)
Admission: EM | Admit: 2021-01-15 | Discharge: 2021-01-16 | Disposition: A | Discharge status: Home / Self Care | Payer: Self-pay | Attending: Emergency Medicine | Admitting: Emergency Medicine

## 2021-01-15 ENCOUNTER — Encounter (HOSPITAL_COMMUNITY): Payer: Self-pay

## 2021-01-15 ENCOUNTER — Other Ambulatory Visit: Payer: Self-pay

## 2021-01-15 ENCOUNTER — Emergency Department (HOSPITAL_COMMUNITY): Payer: Self-pay

## 2021-01-15 DIAGNOSIS — Y9389 Activity, other specified: Secondary | ICD-10-CM | POA: Insufficient documentation

## 2021-01-15 DIAGNOSIS — W260XXA Contact with knife, initial encounter: Secondary | ICD-10-CM | POA: Insufficient documentation

## 2021-01-15 DIAGNOSIS — S61512A Laceration without foreign body of left wrist, initial encounter: Secondary | ICD-10-CM | POA: Insufficient documentation

## 2021-01-15 DIAGNOSIS — Z23 Encounter for immunization: Secondary | ICD-10-CM | POA: Insufficient documentation

## 2021-01-15 NOTE — ED Triage Notes (Signed)
Pt reports that he was at his son's birthday party, was opening a package, and cut lwft wrist with the tip of a knife.

## 2021-01-15 NOTE — ED Notes (Signed)
Wrist wrapped with gauze

## 2021-01-16 MED ORDER — TETANUS-DIPHTH-ACELL PERTUSSIS 5-2.5-18.5 LF-MCG/0.5 IM SUSY
0.5000 mL | PREFILLED_SYRINGE | Freq: Once | INTRAMUSCULAR | Status: AC
  Administered 2021-01-16: 0.5 mL via INTRAMUSCULAR
  Filled 2021-01-16: qty 0.5

## 2021-01-16 MED ORDER — LIDOCAINE-EPINEPHRINE (PF) 2 %-1:200000 IJ SOLN
10.0000 mL | Freq: Once | INTRAMUSCULAR | Status: AC
  Administered 2021-01-16: 10 mL
  Filled 2021-01-16: qty 20

## 2021-01-16 NOTE — Discharge Instructions (Signed)
Avoid soaking your wound in stagnant or dirty water such as while taking a bath. You can shower normally. Keep the area clean with mild soap and warm water. Do not apply peroxide or alcohol to your wound as this can break down newly forming skin and prolong wound healing. If you keep the area bandaged, change the dressing/bandage at least once per day. 

## 2021-01-16 NOTE — ED Provider Notes (Signed)
[facility] EMERGENCY DEPARTMENT Provider Note   CSN: [id number] Arrival date & time: 01/15/21  2151     History Chief Complaint  Patient presents with  . Extremity Laceration    Roger Maldonado is a 40 y.o. male.  The history is provided by the patient. No language interpreter was used.  Laceration Location:  Hand Hand laceration location:  L wrist Length:  Punctate Quality: straight   Bleeding: controlled   Time since incident:  7 hours Laceration mechanism:  Knife Pain details:    Quality:  Aching   Severity:  Mild   Timing:  Constant   Progression:  Unchanged Foreign body present:  No foreign bodies Ineffective treatments:  None tried Tetanus status:  Unknown Associated symptoms: no focal weakness, no numbness and no swelling       History reviewed. No pertinent past medical history.  There are no problems to display for this patient.   Past Surgical History:  Procedure Laterality Date  . HAND SURGERY Right 2007       Family History  Family history unknown: Yes    Social History   Tobacco Use  . Smoking status: Never Smoker  . Smokeless tobacco: Never Used  Substance Use Topics  . Alcohol use: Yes  . Drug use: No    Home Medications Prior to Admission medications   Medication Sig Start Date End Date Taking? Authorizing Provider  predniSONE (DELTASONE) 20 MG tablet Two daily with food 03/28/19   Elvina Sidle, MD    Allergies    Patient has no known allergies.  Review of Systems   Review of Systems  Skin: Positive for wound.  Neurological: Negative for focal weakness.  Ten systems reviewed and are negative for acute change, except as noted in the HPI.    Physical Exam Updated Vital Signs BP (!) 152/100   Pulse 84   Temp 98 F (36.7 C) (Oral)   Resp 20   Ht 5\' 8"  (1.727 m)   Wt 86.2 kg   SpO2 96%   BMI 28.89 kg/m   Physical Exam Vitals and nursing note reviewed.  Constitutional:      General: He is not in  acute distress.    Appearance: He is well-developed. He is not diaphoretic.     Comments: Nontoxic-appearing and in no distress  HENT:     Head: Normocephalic and atraumatic.  Eyes:     General: No scleral icterus.    Conjunctiva/sclera: Conjunctivae normal.  Cardiovascular:     Rate and Rhythm: Normal rate and regular rhythm.     Pulses: Normal pulses.     Comments: Distal radial pulse 2+ in the left upper extremity. Pulmonary:     Effort: Pulmonary effort is normal. No respiratory distress.     Comments: Respirations even and unlabored Musculoskeletal:        General: Normal range of motion.     Cervical back: Normal range of motion.     Comments: Punctate wound to the volar aspect of the left wrist.  Slow, consistent bleeding.  No palpable, pulsatile bleeding.  Skin:    General: Skin is warm and dry.     Coloration: Skin is not pale.     Findings: No erythema or rash.  Neurological:     Mental Status: He is alert and oriented to person, place, and time.  Psychiatric:        Behavior: Behavior normal.     ED Results / Procedures / Treatments  Labs (all labs ordered are listed, but only abnormal results are displayed) Labs Reviewed - No data to display  EKG None  Radiology DG Wrist Complete Left  Result Date: 01/15/2021 CLINICAL DATA:  Wrist injury EXAM: LEFT WRIST - COMPLETE 3+ VIEW COMPARISON:  None. FINDINGS: There is no evidence of fracture or dislocation. There is no evidence of arthropathy or other focal bone abnormality. Soft tissue swelling and laceration seen on the volar aspect of the wrist. IMPRESSION: No acute osseous abnormality. Laceration soft tissue swelling along the volar aspect of the wrist. Electronically Signed   By: Jonna Clark M.D.   On: 01/15/2021 22:36    Procedures .Marland KitchenLaceration Repair  Date/Time: 01/16/2021 2:22 AM Performed by: Antony Madura, PA-C Authorized by: Antony Madura, PA-C   Consent:    Consent obtained:  Verbal and emergent  situation   Consent given by:  Patient   Risks discussed:  Infection, pain and poor cosmetic result   Alternatives discussed:  No treatment Universal protocol:    Test results available: yes     Imaging studies available: yes     Required blood products, implants, devices, and special equipment available: yes     Patient identity confirmed:  Verbally with patient and arm band Anesthesia:    Anesthesia method:  Local infiltration   Local anesthetic:  Lidocaine 2% WITH epi Laceration details:    Location:  Hand   Hand location:  L wrist   Length (cm):  0.5 Pre-procedure details:    Preparation:  Patient was prepped and draped in usual sterile fashion Exploration:    Hemostasis achieved with:  Direct pressure   Imaging outcome: foreign body not noted     Contaminated: no   Treatment:    Area cleansed with:  Povidone-iodine   Amount of cleaning:  Standard   Irrigation solution:  Tap water   Irrigation method:  Tap   Debridement:  None Skin repair:    Repair method:  Sutures   Suture size:  5-0   Suture material:  Chromic gut   Suture technique:  Figure eight   Number of sutures:  1 Approximation:    Approximation:  Close Repair type:    Repair type:  Simple Post-procedure details:    Procedure completion:  Tolerated well, no immediate complications     Medications Ordered in ED Medications  lidocaine-EPINEPHrine (XYLOCAINE W/EPI) 2 %-1:200000 (PF) injection 10 mL (10 mLs Infiltration Given by Other 01/16/21 0200)  Tdap (BOOSTRIX) injection 0.5 mL (0.5 mLs Intramuscular Given 01/16/21 0140)    ED Course  I have reviewed the triage vital signs and the nursing notes.  Pertinent labs & imaging results that were available during my care of the patient were reviewed by me and considered in my medical decision making (see chart for details).    MDM Rules/Calculators/A&P                          Tdap booster given. Laceration occurred < 12 hours prior to repair which was  well tolerated. Pt has no comorbidities to effect normal wound healing. Discussed suture home care w pt and answered questions. Pt to follow up for wound recheck PRN. Pt is hemodynamically stable with no complaints prior to discharge.   Final Clinical Impression(s) / ED Diagnoses Final diagnoses:  Wrist laceration, left, initial encounter    Rx / DC Orders ED Discharge Orders    None       Khyrin Trevathan,  Tresa Endo, PA-C 01/16/21 0224    Dione Booze, MD 01/16/21 [phone]

## 2021-12-05 IMAGING — CR DG WRIST COMPLETE 3+V*L*
4 series · 4 of 4 positions shown · non-contrast
Comparison: None.

CLINICAL DATA: Wrist injury

EXAM:
LEFT WRIST - COMPLETE 3+ VIEW

[wrist pa]
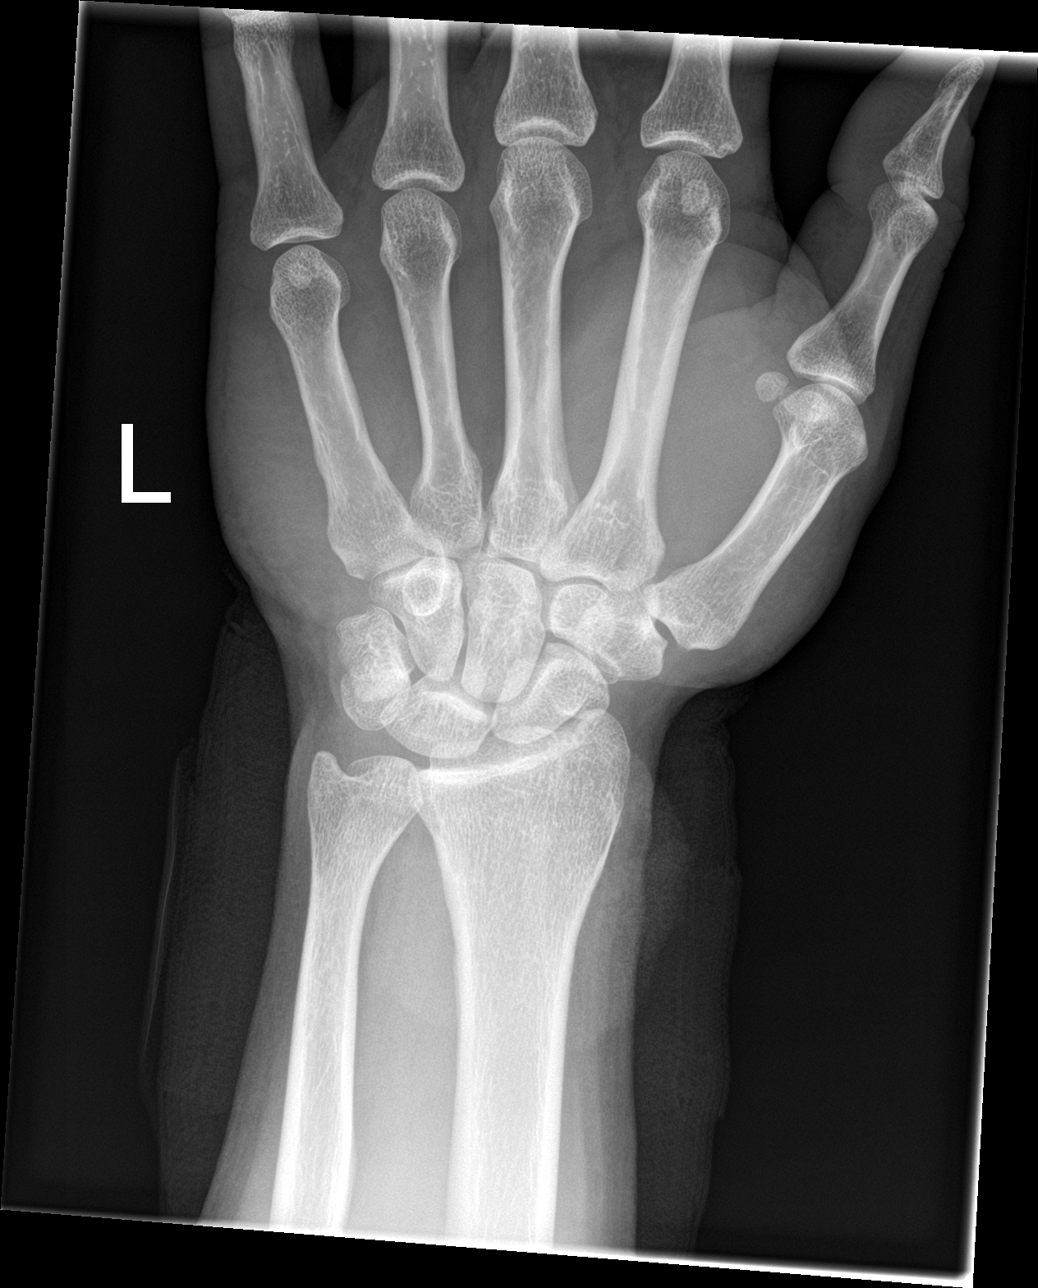

[wrist obl]
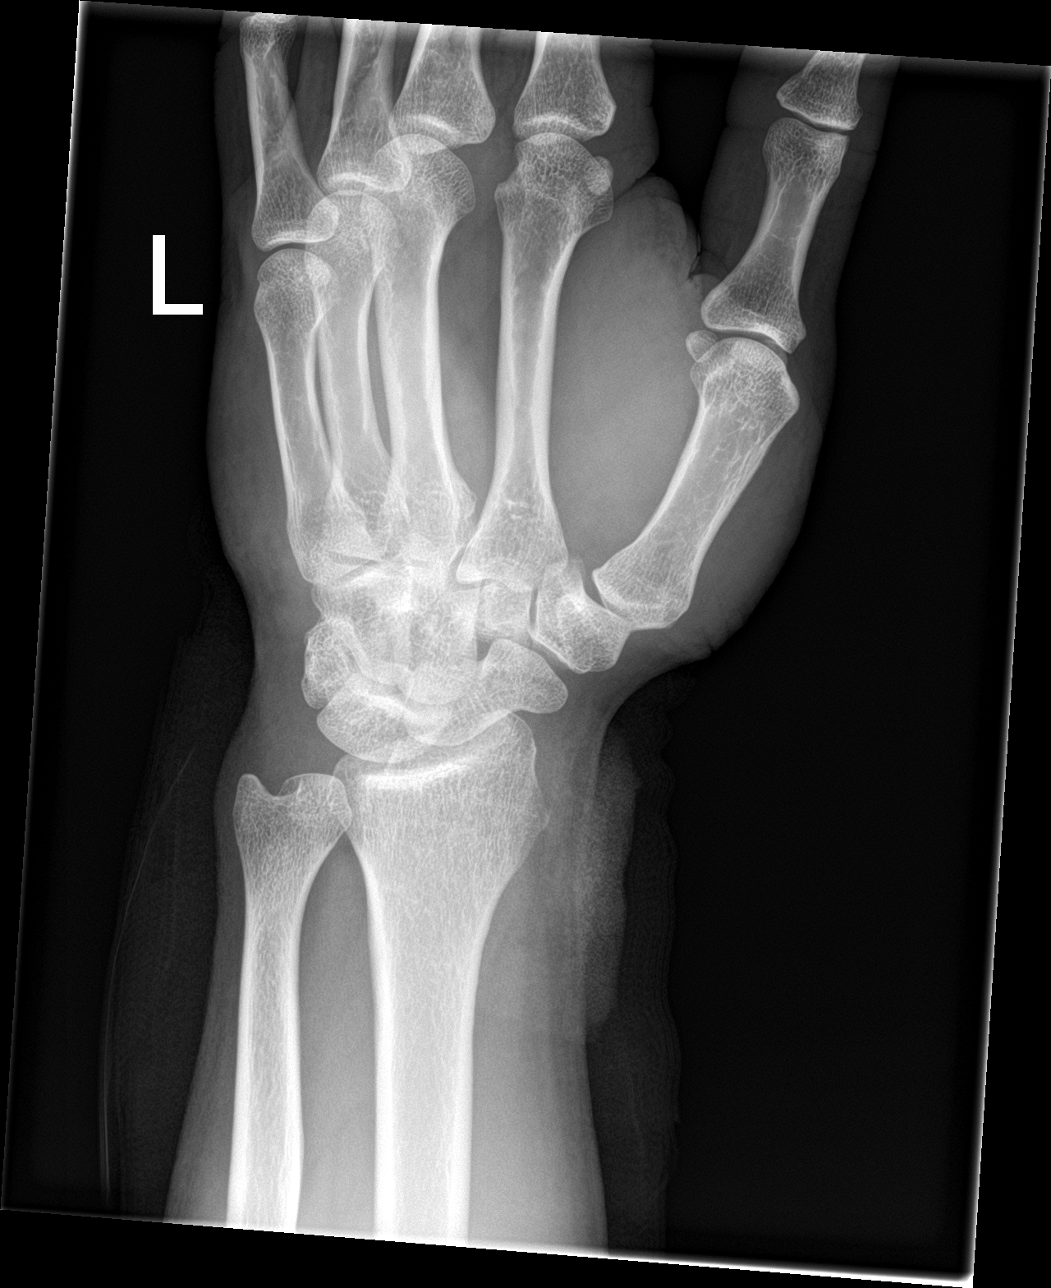

[wrist lat]
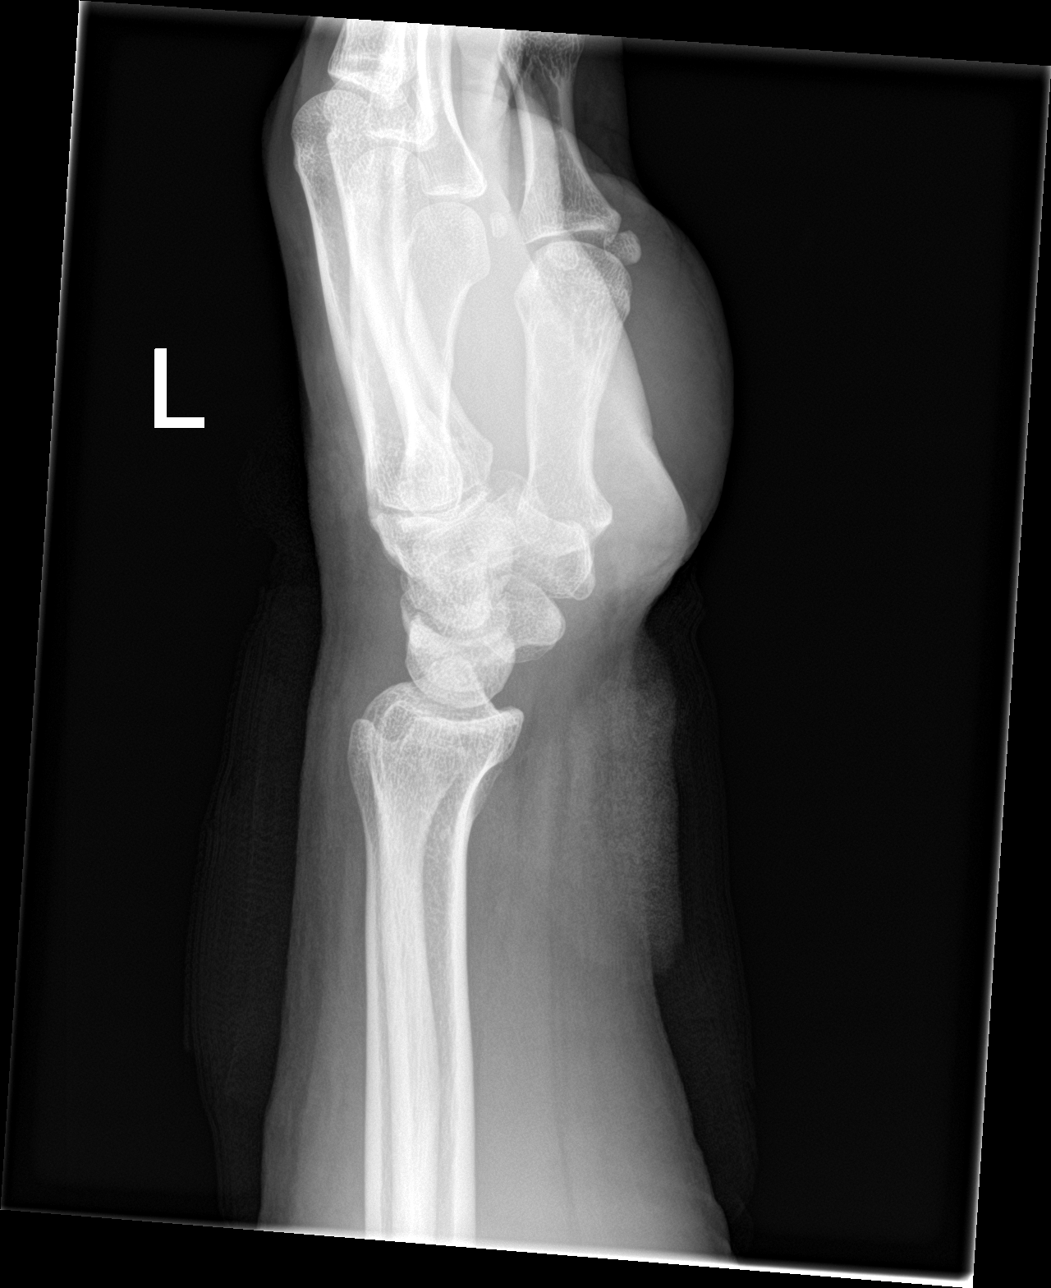

[wrist navicular]
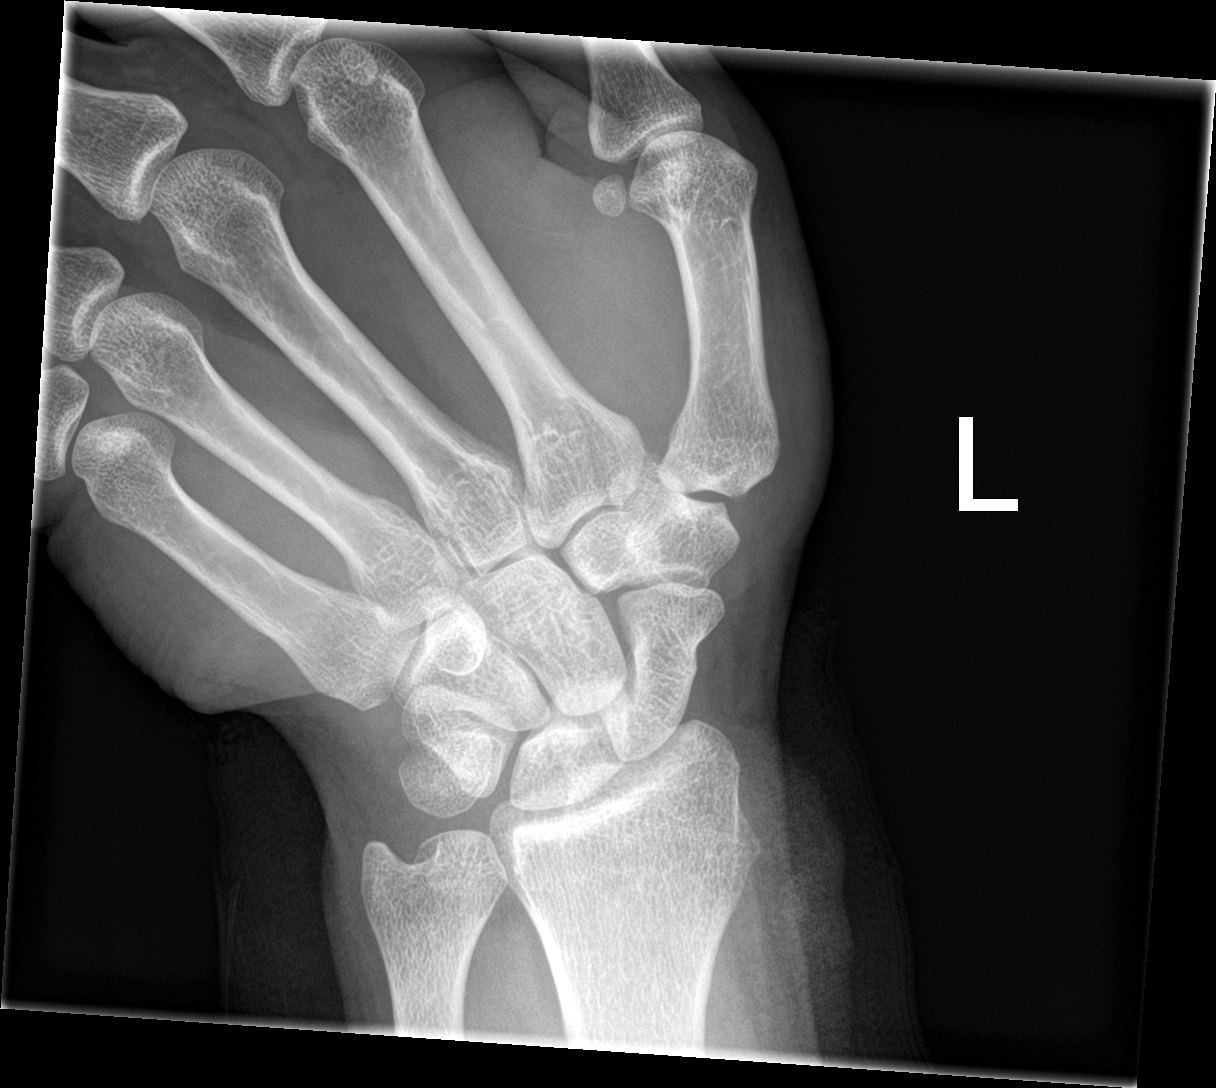

[4 of 4 positions shown; findings below may reference images not displayed]

FINDINGS: There is no evidence of fracture or dislocation. There is no
evidence of arthropathy or other focal bone abnormality. Soft tissue
swelling and laceration seen on the volar aspect of the wrist.
IMPRESSION: No acute osseous abnormality. Laceration soft tissue swelling along
the volar aspect of the wrist.
